# Patient Record
Sex: Female | Born: 1935 | Race: White | Hispanic: No
Health system: Midwestern US, Community
[De-identification: ages and names within clinical notes are randomized; demographics above are authoritative.]

## PROBLEM LIST (undated history)

## (undated) DIAGNOSIS — Z1231 Encounter for screening mammogram for malignant neoplasm of breast: Secondary | ICD-10-CM

## (undated) DIAGNOSIS — R300 Dysuria: Secondary | ICD-10-CM

## (undated) DIAGNOSIS — H6123 Impacted cerumen, bilateral: Secondary | ICD-10-CM

## (undated) DIAGNOSIS — M542 Cervicalgia: Secondary | ICD-10-CM

## (undated) DIAGNOSIS — M1990 Unspecified osteoarthritis, unspecified site: Secondary | ICD-10-CM

## (undated) DIAGNOSIS — Z139 Encounter for screening, unspecified: Secondary | ICD-10-CM

## (undated) DIAGNOSIS — M47812 Spondylosis without myelopathy or radiculopathy, cervical region: Secondary | ICD-10-CM

## (undated) DIAGNOSIS — K5730 Diverticulosis of large intestine without perforation or abscess without bleeding: Secondary | ICD-10-CM

---

## 1993-07-07 LAB — PAP SMEAR
Clinical information: NEGATIVE
Pathology Report: NORMAL

## 1995-03-01 LAB — PAP SMEAR: Pathology Report: NORMAL

## 1996-02-04 LAB — PAP SMEAR: Last menstrual period reported: 15

## 1999-01-17 LAB — PAP SMEAR

## 2004-11-08 ENCOUNTER — Encounter

## 2010-01-31 ENCOUNTER — Encounter

## 2010-05-09 IMAGING — CR DG CHEST 2V
2 series · 2 of 2 positions shown · non-contrast
Comparison: None

CLINICAL DATA: Preop for knee replacement

CHEST - 2 VIEW

[view not recorded (1 of 2)]
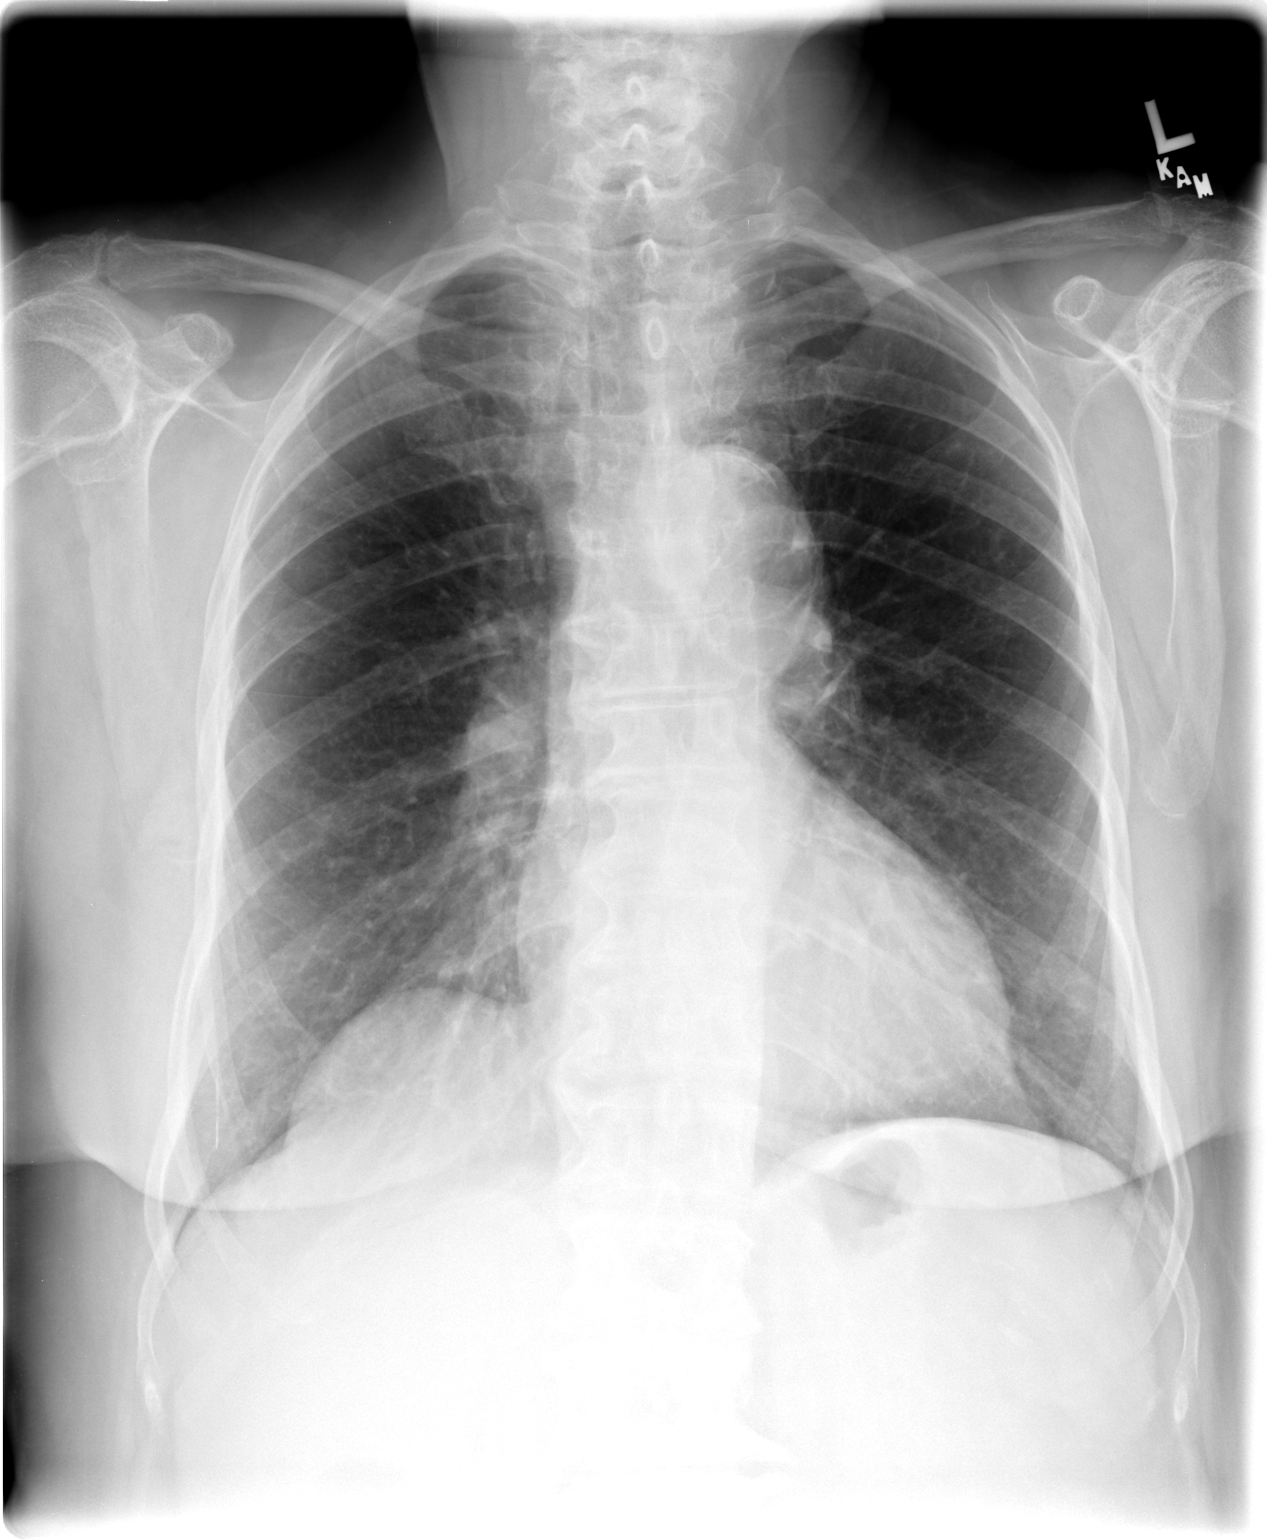

[view not recorded (2 of 2)]
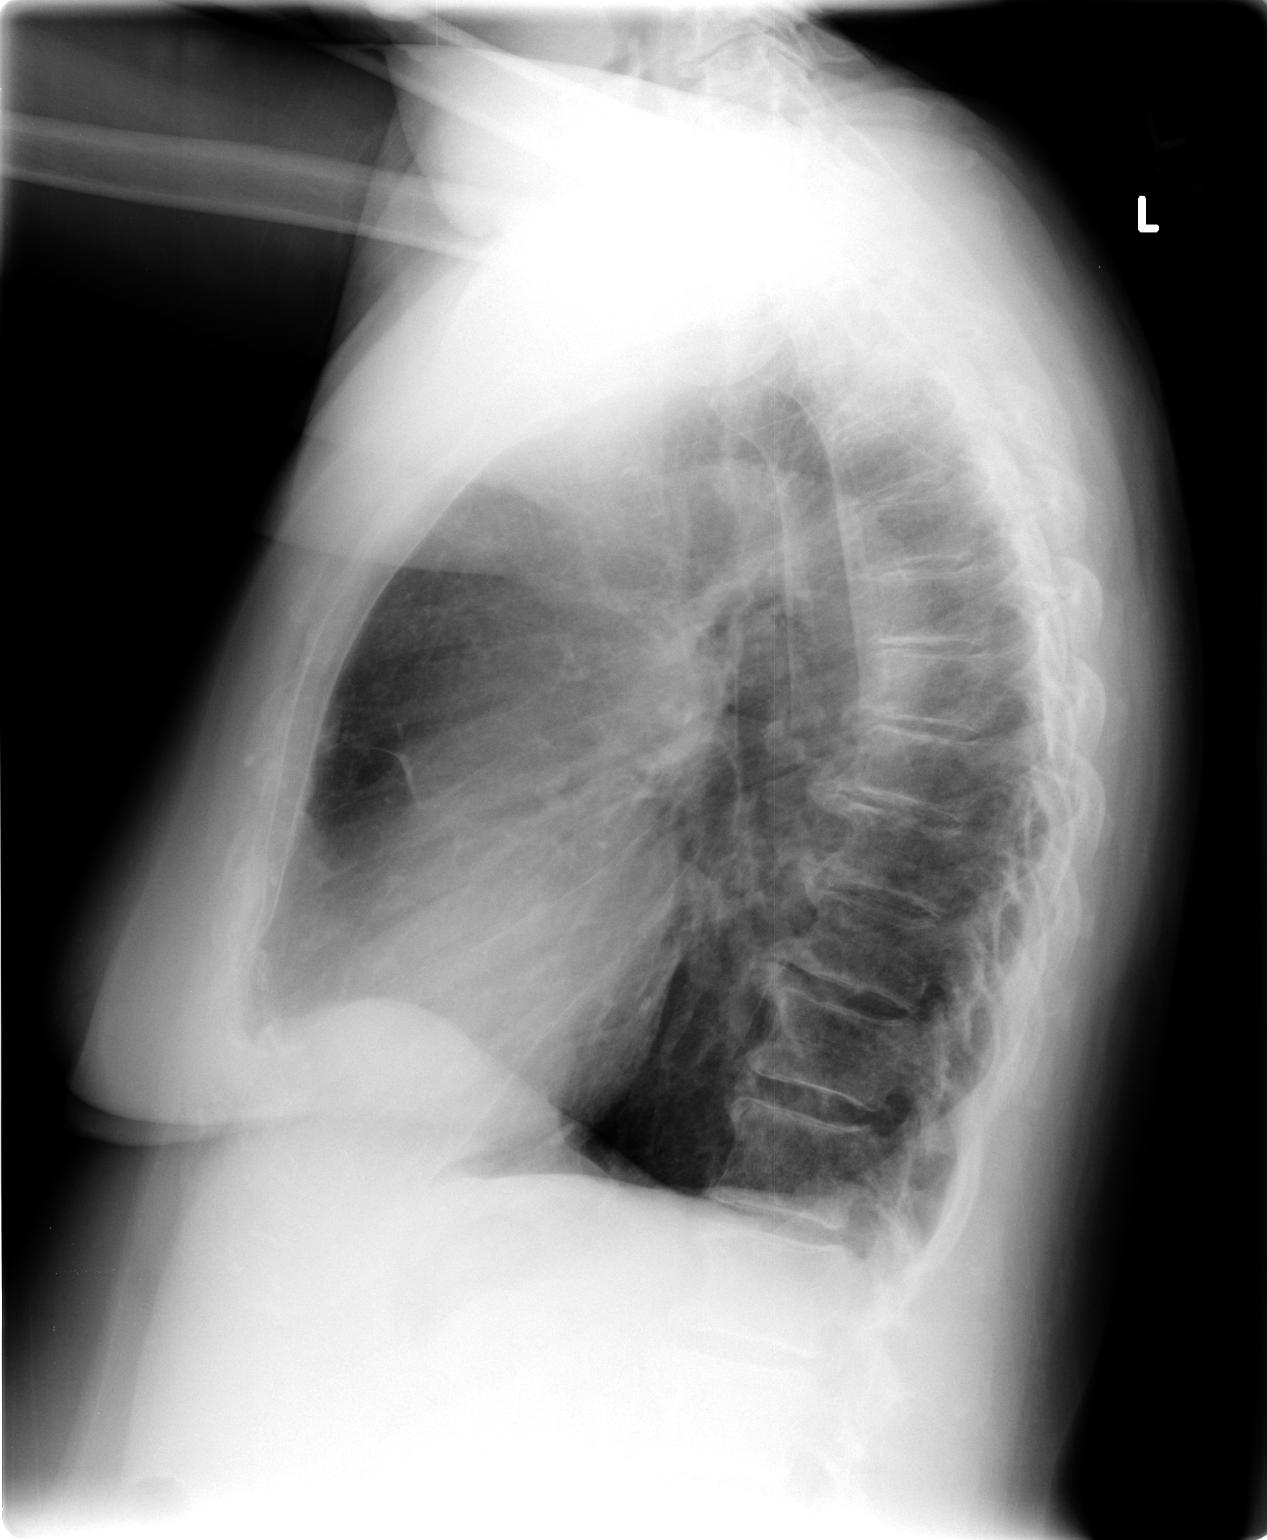

[2 of 2 positions shown; findings below may reference images not displayed]

FINDINGS: The lungs are clear but hyperaerated.  The heart is
within normal limits in size.  There are degenerative changes
throughout the thoracic spine.
IMPRESSION: No active lung disease.  Slight hyperaeration.

## 2010-06-01 NOTE — Progress Notes (Addendum)
See entries from Nurse/Technician from earlier in today's visit .    The identity of the patient, Leslie Hammond, was confirmed by  name & medical record number     EOM:  OU full ductions    Muscle Balance:  Orthophoria        SLE:             Lids/lashes: OU 1+ blepharitis                   Conj/sclera: OU no injection                            Cornea: OU clear without staining                      A/C:    OU deep and quiet                       Iris:    OU normal                        Lens:  OU  nuclear sclerosis (1-2+); mild cortical spoking                 FUNDUS:        Disc (C/D):      OU pink and sharp margins     0.4 cupping OU                      Macula:       OU normal with one drusen inferior, peripheral macula OS (as before)                  Periphery:       OU no holes or tears or RD                   IMPRESSION/PLAN:                          1) Nuclear Sclerosis (moderate) OU   -patient not desiring surgery at present; will call if decides to proceed                -handout given regarding cataracts/cataract surgery                          2) Blepharitis OU- pt instructed regarding lid scrubs/hygiene   -done today in office by me to demonstrate technique       Patient also to trial artificial tears +/- Zaditor for itching & tearing                                                                              RTC 1 year/prn sooner changes    The patient's  examination today included the use of dilating drops.  The patient was informed of the potential, time-limited detrimental effects of these medications on the vision and the possible inability of the patient to safely drive.  The patient was also  offered sunglasses to assist with the photosensitivity, sometimes experienced with pharmacologic dilation.

## 2010-06-01 NOTE — Patient Instructions (Addendum)
At your next visit please plan to be here for 1 to 2 hours. Your visit may include a dilated eye exam and additional testing.    Please bring your glasses with you to future eye appointments.    Please bring eye medications you are taking, including any over the counter products.    You may be receiving a survey in the mail regarding your satisfaction with this ophthalmology visit. We in the Ophthalmology Department would like to request that you complete this questionnaire because your feedback is valuable and will allow us to continue providing the highest quality care.

## 2010-06-01 NOTE — Progress Notes (Addendum)
The identity of patient, Leslie Hammond , a 77 year old year old female, has been verified by name and medical record number.  Chief Complaint   Patient presents with   . CATARACT     Here for yearly check. States OD>>OS waters, itches , turns red and  it fells like there is a film over it.      Pts medications, allergies and smoking history reviewed.    PAST OCULAR HISTORY:  PVD OD  Cataracts OU   Hx of Skin Tags OU (multiple) removed 04/15/09      Last Dilation: 04/15/09  Last C/D: 0.4 ou   Last Visual Field: n/a     EYE MEDICATIONS:  None      PAST MEDICAL HISTORY:   Diabetes: no   Hypertension: no   Sarcoid/Autoimmune: no   Thyroid: no     FAMILY OCULAR HISTORY:   Glaucoma: no   AMD: no    No results found for this basename: HGA1C       Angles: appear open ou    Iris: no nvi ou    Pt. Education: none    An After Visit Summary was printed and given to the patient.

## 2010-06-15 LAB — MICROSCOPIC URINALYSIS: WBC, UA: >100 /HPF (ref 0–5)

## 2010-06-15 LAB — URINALYSIS, DIPSTICK ONLY
Bilirubin Urine: NEGATIVE
Glucose, Ur: NEGATIVE GM%
Ketones, Urine: NEGATIVE MG/DL
Nitrite, Urine: POSITIVE — AB
Protein, UA: 100 MG/DL — AB
Specific Gravity, UA: 1.015 (ref 1.005–1.030)
Urobilinogen, Urine: 0.2 U/dL (ref 0.2–4.0)
pH, UA: 7.5 (ref 5.0–8.0)

## 2010-06-15 NOTE — Progress Notes (Addendum)
The patient, Leslie Hammond, identity was verified by name and MRN.    Inc burning w uriantion  No fever  Has tried inc cranberry to no avail    Also req routine labs    BP 134/68   Pulse 96   Temp(Src) 97.8 ??F (36.6 ??C) (Oral)   Resp 16   Ht 5\' 3"  (1.6 m)   Wt 183 lb (83.008 kg)   BMI 32.42 kg/m2  Physical Examination: General appearance - alert, well appearing, and in no distress  Ua ntoed    ASSESSMENT/PLAN:    DYSURIA  (primary encounter diagnosis)  Note: uti noted  Plan: SULFAMETHOXAZOLE-TRIMETHOPRIM 800-160 MG ORAL         TAB, LIPID PANEL, NON-FASTING (DIRECT LDL, HDL,        CHOL), ALT, PLASMA, BASIC METABOLIC PANEL (NA,         K, CL, CO2, BUN, CR, GLU, TOTAL CA)        Fluids ad lib      FOLLOW-UP: sx persist

## 2010-06-16 LAB — BASIC METABOLIC PANEL
Anion Gap: 18.1 mmol/L — ABNORMAL HIGH (ref 7–18)
BUN: 14 MG/DL (ref 7–17)
CO2: 26 mmol/L (ref 22–31)
Calcium: 9.5 MG/DL (ref 8.4–10.2)
Chloride: 104 mmol/L (ref 99–111)
Creatinine: 0.7 MG/DL (ref 0.52–1.04)
GFR African American: 60 mL/min/m
GFR Non-African American: >60 mL/min/m (ref >60)
Glucose: 102 MG/DL (ref 80–115)
Potassium: 5.1 mmol/L — ABNORMAL HIGH (ref 3.5–5.0)
Sodium: 143 mmol/L (ref 135–150)

## 2010-06-16 LAB — LIPID PANEL
Cholesterol, Total: 232 MG/DL — ABNORMAL HIGH (ref <200)
HDL: 44 MG/DL (ref >40)
LDL Direct: 141 MG/DL — ABNORMAL HIGH (ref 30–130)

## 2010-06-16 LAB — ALT: ALT: 22 U/L (ref 9–52)

## 2010-06-21 ENCOUNTER — Telehealth

## 2010-06-21 NOTE — Telephone Encounter (Addendum)
Attempted to reach at listed home number. message left on tad. when member  Returns call please relay dr Signa Kell message

## 2010-06-21 NOTE — Telephone Encounter (Addendum)
Pt currently treated for uti  Pls have her repeat ua after atb to ensure all clear    chol mild elevation follow diet  Potassium very mildly elevated, pls refrain from inc k in diet    Otherwise rest of test good

## 2010-06-21 NOTE — Telephone Encounter (Addendum)
Pt given Dr. Precious Haws as noted, Pt. Relates good understanding.  Low chol and potassium info has been mailed to pt.

## 2010-06-22 LAB — URINE WITH REFLEXED MICRO
Bilirubin Urine: NEGATIVE
Blood, Urine: NEGATIVE
Glucose, Ur: NEGATIVE GM%
Ketones, Urine: NEGATIVE MG/DL
Leukocyte Esterase, Urine: NEGATIVE
Nitrite, Urine: NEGATIVE
Protein, UA: NEGATIVE MG/DL
Specific Gravity, UA: 1.014 (ref 1.005–1.030)
Urobilinogen, Urine: NEGATIVE U/dL (ref 0.2–4.0)
pH, UA: 7 (ref 5.0–8.0)

## 2011-02-11 NOTE — Telephone Encounter (Addendum)
mbr checking status of msg.    Called to facility spoke with Bev who instructed msg be sent to you.   mbr may be reached at   Telephone Information:   Home Phone [phone]   Work Phone Not on file.     Thank you

## 2011-02-11 NOTE — Telephone Encounter (Addendum)
Office Visit  Leslie Hammond 77 year old female  Chief Complaint: leg pain and swelling x 3 days now ankle purple and red and calf is swollen,      With Provider: Please call mbr for appt     Preferred Days:  Please call mbr for appt       Preferred Time:  no preference    Preferred Location: [place]    Preferred Appointment: Based on template review appointment is being forwarded to the department for scheduling assistance    Contact Phone Number:   Telephone Information:   Home Phone [phone]         Patient advised that office/PCP has 24-48 business hours to return their call.   [name] a 77 year old with chief complaint of leg pain and swelling x 3 days now ankle purple and red and calf is swollen,.Triaged and assessed, attempted to make ,  informed mbr that will send message to office and if has not heard from [place] later today give [place] a call back there may be message waiting for  Her, mbr can be reached at   [phone].       Triaged and Assessed: Vascular Prob. (Lower Extremeties)-P Protocol    N Bleeding that won't stop with pressure and signs of hypovolemia (dizziness, tachycardia, etc).    N Sudden onset of cold, pale. and/or painful extremity.    N Leg edema/pain with SOB or chest pain.      N Non healing intermittantly bleeding varicosity.    N Signs of infection/elevated temperature.    N History of DVT    Y Gradual onset sevr/moderate pain and edema    Schedule SDA (appt within 2-24h) or follow UC process.  1) Apply direct pressure if bleeding.  2) Avoid rubbing or massaging area.   3) Avoid tight clothing.  4) Avoid crossing legs at knees.  5) Avoid sitting or standing for prolonged periods.     7) Call back if sx persist, change or worsen.  Marland Kitchen

## 2011-02-11 NOTE — Telephone Encounter (Addendum)
Pls have her got to uc/ed for eval re leg swelling and color changes

## 2011-02-11 NOTE — Telephone Encounter (Addendum)
Office Visit  Leslie Hammond 77 year old female  Chief Complaint: leg pain and swelling x 3 days now ankle purple and red and calf is swollen,      With Provider: Please call mbr for appt     Preferred Days:  Please call mbr for appt       Preferred Time:  no preference    Preferred Location: [place]    Preferred Appointment: Based on template review appointment is being forwarded to the department for scheduling assistance    Contact Phone Number:   Telephone Information:   Home Phone [phone]         Patient advised that office/PCP has 24-48 business hours to return their call.   Leslie Hammond a 77 year old with chief complaint of leg pain and swelling x 3 days now ankle purple and red and calf is swollen,.Triaged and assessed, attempted to make ,  informed mbr that will send message to office and if has not heard from [place] later today give [place] a call back there may be message waiting for  Her, mbr can be reached at   [phone].       Triaged and Assessed: Vascular Prob. (Lower Extremeties)-P Protocol    N Bleeding that won't stop with pressure and signs of hypovolemia (dizziness, tachycardia, etc).    N Sudden onset of cold, pale. and/or painful extremity.    N Leg edema/pain with SOB or chest pain.      N Non healing intermittantly bleeding varicosity.    N Signs of infection/elevated temperature.    N History of DVT    Y Gradual onset sevr/moderate pain and edema    Schedule SDA (appt within 2-24h) or follow UC process.  1) Apply direct pressure if bleeding.  2) Avoid rubbing or massaging area.   3) Avoid tight clothing.  4) Avoid crossing legs at knees.  5) Avoid sitting or standing for prolonged periods.     7) Call back if sx persist, change or worsen.  Marland Kitchen

## 2011-02-15 NOTE — Progress Notes (Addendum)
Reviewed above:    Leslie Hammond 77 year old female with issues expressed to the nurse   Chief Complaint   Patient presents with   ??? HOSPITAL FOLLOW UP     per member was seen in [facility] for cellulitis in right leg    .            Recorded Prescriptions    Current Outpatient Prescriptions   Medication Sig Dispense Refill   ??? DESOXIMETASONE 0.25 % TOP CREA APPLY TWICE DAILY AS NEEDED TO LEGS  60  5       More record Information    BP Readings from Last 3 Encounters:   02/15/11 136/68   06/15/10 134/68   06/01/10 142/77     Wt Readings from Last 3 Encounters:   02/15/11 172 lb (78.019 kg)   06/15/10 183 lb (83.008 kg)   06/01/10 180 lb (81.647 kg)     WBC COUNT   Date Value Range Status   03/04/2008 5.4  4.5-11.0 (K/uL) Final        HEMATOCRIT   Date Value Range Status   03/04/2008 39.2  36.0-48.0 (%) Final        HGB   Date Value Range Status   03/04/2008 13.4  12.0-16.0 (GM/DL) Final        PLATELETS, AUTOMATED COUNT   Date Value Range Status   03/04/2008 235  150-450 (K/uL) Final        CHOLESTEROL   Date Value Range Status   06/15/2010 232* <200 (MG/DL) Final        HDL   Date Value Range Status   06/15/2010 44  >40 (MG/DL) Final      (NOTE)      >=60 mg/dl is Desirable        TRIGLYCERIDE   Date Value Range Status   09/02/2004 132  35-160 (MG/DL) Final        ALT   Date Value Range Status   06/15/2010 22  9-52 (U/L) Final        AST   Date Value Range Status   03/04/2008 34  14-48 (U/L) Final        BUN   Date Value Range Status   06/15/2010 14  7-17 (MG/DL) Final        CREATININE   Date Value Range Status   06/15/2010 0.70  0.52-1.04 (MG/DL) Final        POTASSIUM   Date Value Range Status   06/15/2010 5.1* 3.5-5.0 (mmol/L) Final        SODIUM   Date Value Range Status   06/15/2010 143  135-150 (mmol/L) Final        CO2   Date Value Range Status   06/15/2010 26  22-31 (mmol/L) Final        CHLORIDE    Value Range Status   06/15/2010 104  99-111 (mmol/L) Final        TSH   Date Value Range Status   03/04/2008 1.780   0.465-4.68 (MIU/ML) Final        GLUCOSE, FASTING   Date Value Range Status   09/02/2004 108  70-110 (MG/DL) Final        GLUCOSE, RANDOM   Date Value Range Status   06/15/2010 102  80-115 (MG/DL) Final         Immunization History   Administered Date(s) Administered   ??? INF (Influenza) unspecified formulation 02/09/1996, 01/23/1998, 03/01/1999, 02/21/2000, 02/27/2001, 01/23/2002, 01/23/2002, 03/03/2003, 01/27/2004,  02/03/2009   ??? INFs (Influenza split virus ). 02/11/2011   ??? INFs 101yrs and over (Influenza) 01/24/2010   ??? INFs 67yrs-adult (Influenza) 02/09/2005, 01/31/2006, 03/04/2008   ??? INFs 47yrs and over (influenza) 02/26/2007   ??? PNUps (Pneumococcal polysaccharide, pneumonia) 02/26/1997   ??? Td 75yrs-adult (Tetanus, diphtheria) 05/02/2000             Today      Reviewed above    Vitals BP 136/68   Pulse 60   Temp(Src) 97.8 ??F (36.6 ??C) (Oral)   Resp 20   Ht 5' 3.5" (1.613 m)   Wt 172 lb (78.019 kg)   BMI 29.99 kg/m2      HPI:  Patient concerned with - here for follow up cellulitis.  Had negative [place] for dvt.  Told had cellulitis and sent out on both keflex and bactrim.  Thinks the bactrim is upsetting her stomach.  States the right leg swelling is much better.  Chronic exzema of her lower legs.  Denies itching prior to the episode.  Does state had been out in the yard working on her gardenseveral days before.        Physical Exam  GEN alert oriented, healthy appearing wf, no distress   extr - no redness, warmth or swelling.  Does have varicose veins and chronic venous stasis changes.     OTHER ISSUES RAISED none    ASSESSMENT  Diagnosis   1. CELLULITIS (682.9)  - much improved after 5 days of antibiotics.  Will discontinue the bactrim due to gi upset and complete the course of keflex.     2. ECZEMA (692.9)  - stable     3. VARICOSE VEINS OF LOWER LIMB, W COMPLICATION OF EDEMA, PAIN, OR SWELLING (454.8) - stable.         PLAN:  May discontinue the sulfa-tmp.     Complete the 10 day  course of the cephalexin     tanish  - please get me the [place] papers.      Follow Up if redness, warmth, swelling recur or as previously advised by your pcp.             RETURN TO CLINIC IF YOU HAVE NEW OR WORSE SYMPTOMS

## 2011-02-15 NOTE — Patient Instructions (Addendum)
ASSESSMENT  Diagnosis   1. CELLULITIS (682.9)  - much improved after 5 days of antibiotics.  Will discontinue the bactrim due to gi upset and complete the course of keflex.     2. ECZEMA (692.9)  - stable     3. VARICOSE VEINS OF LOWER LIMB, W COMPLICATION OF EDEMA, PAIN, OR SWELLING (454.8) - stable.         PLAN:  May discontinue the sulfa-tmp.     Complete the 10 day  course of the cephalexin     tanish - please get me the [place] papers.      Follow Up if redness, warmth, swelling recur or as previously advised by your pcp.             RETURN TO CLINIC IF YOU HAVE NEW OR WORSE SYMPTOMS

## 2011-03-06 ENCOUNTER — Encounter

## 2011-04-27 NOTE — Progress Notes (Addendum)
The patient, ANNALEE MEYERHOFF, identity was verified by name and MRN.    Glenford Peers sx started 3 weeks ago  No fever  Seem better last 2 days but cont to have nasal discharge and feeling that sinuses are full  Not on any meds currently    BP 124/72   Pulse 82   Temp(Src) 97.5 ??F (36.4 ??C) (Oral)   Resp 16   Ht 5\' 5"  (1.651 m)   Wt 177 lb (80.287 kg)   BMI 29.45 kg/m2  Physical Examination: General appearance - alert, well appearing, and in no distress  Mouth - mucous membranes moist, pharynx normal without lesions  Chest - clear to auscultation, no wheezes, rales or rhonchi, symmetric air entry      ASSESSMENT/PLAN:    VIRAL RESPIRATORY INFECTION  (primary encounter diagnosis)  Note: likleya t tail end, but can try otc clairitin d 1-2x/day to help with sx    FOLLOW-UP: sx persist/worsens

## 2011-04-27 NOTE — Patient Instructions (Addendum)
Claritin d 1-2x/day

## 2011-05-10 NOTE — Progress Notes (Addendum)
Flu documented 02/13/11 already

## 2012-01-11 ENCOUNTER — Encounter

## 2012-01-11 NOTE — Patient Instructions (Addendum)
 ASSESSMENT  Diagnosis   1. SCREENING FOR OSTEOPOROSIS (V82.81)    2. PROPHYLACTIC VACCINE FOR INFLUENZA (V04.81)    3. BILAT CERUMEN IMPACTION (380.4)    4. NECK PAIN (723.1)        PLAN:  Orders Placed This Encounter   . DEXA BONE DENSITY, AXIAL SKELETON [77080C]   . XR CERVICAL SPINE 2 OR 3 VIEWS   . VACC INFLUENZA, TRIVALENT, SPLIT VIRUS, 4 YRS-ADULT [90658G]   . VACC ADMIN, FIRST IM OR SUBQ VACCINE TOXOID [90471B]   . FLUVIRIN 2013-2014 45 MCG (15 MCG X 3)/0.5 ML IM SUSP   . FLUVIRIN 2013-2014 45 MCG (15 MCG X 3)/0.5 ML IM SUSP   . EAR DROPS (CARBAMIDE PEROXIDE) 6.5 % OTIC DROP   Continue aleve   Apply moistheat to neck several times a day for 15 minutes at a time  Neck booklet   Follow up in 4 days for nurse clinic ear irrigation   Follow Up Call or return to clinic prn if these symptoms worsen or fail to improve as anticipated.            At this time in your life, it's more important than ever to be fit and stay  healthy. [employer] can help you reach your health and fitness  goals through use of a basic fitness membership at many local fitness centers.     Your  [facility] Medicare Plus includes a Psychologist, counselling at no additional cost. Your fitness membership includes:  . use of equipment  . group exercise classes  . social events and much more    To activate your [employer] Membership, and find a convenient location near your home, call [phone] ([email or web address]:[phone]), or log on to the website at:  [email or web address]

## 2012-01-11 NOTE — Progress Notes (Addendum)
The patient, Leslie Hammond, identity was verified by name and MRN.    The patient presents with c/o 3 week hx of trouble raising left arm  Pain in left side of neck down shoulder , thinks getting better     States feeling like something moving or crawling in neck   Denies numbness, tingling,  Denies trauma  Using aleve which helps, also using weights which improves motion     Denies chest pain, sob   Has hearing aids , does not use     P.E.  BP 122/68   Pulse 76   Temp(Src) 98.2 ??F (36.8 ??C) (Oral)   Resp 18   Ht 5\' 5"  (1.651 m)   Wt 177 lb (80.287 kg)   BMI 29.45 kg/m2   tm's not well visualized due to cerumen bilat   Neck: no spinal tenderness, fair rom with flexion, extension, rotation  Some discomfort with flexion and rotation to left   +tenderness left upper back musculature   Strength 5/5 bilat ue/le   Slow motion with reaching back , slightly decreased     ASSESSMENT  Diagnosis   1. SCREENING FOR OSTEOPOROSIS (V82.81)    2. PROPHYLACTIC VACCINE FOR INFLUENZA (V04.81)    3. BILAT CERUMEN IMPACTION (380.4)    4. NECK PAIN (723.1)        PLAN:  Orders Placed This Encounter   ??? DEXA BONE DENSITY, AXIAL SKELETON [77080C]   ??? XR CERVICAL SPINE 2 OR 3 VIEWS   ??? VACC INFLUENZA, TRIVALENT, SPLIT VIRUS, 4 YRS-ADULT [90658G]   ??? VACC ADMIN, FIRSTIM OR SUBQ VACCINE TOXOID [90471B]   ??? FLUVIRIN 2013-2014 45 MCG (15 MCG X 3)/0.5 ML IM SUSP   ??? FLUVIRIN 2013-2014 45 MCG (15 MCG X 3)/0.5 ML IM SUSP   ??? EAR DROPS (CARBAMIDE PEROXIDE) 6.5 % OTIC DROP   Continue aleve   Apply moist heat to neck several times a day for 15 minutes at a time  Neck booklet   Follow up in 4 days for nurse clinic ear irrigation   Follow Up Call or return to clinic prn if these symptoms worsen or fail to improve as anticipated.

## 2012-01-18 ENCOUNTER — Telehealth

## 2012-01-18 ENCOUNTER — Encounter

## 2012-01-18 NOTE — Telephone Encounter (Addendum)
Pt present in nurse clinic, requesting results of x-ray from last week. Pt also requesting physical therapy.

## 2012-01-18 NOTE — Telephone Encounter (Addendum)
I have attempted to contact this patient by phone with the following results: left message to return my call on answering machine.

## 2012-01-18 NOTE — Progress Notes (Addendum)
The patient, Leslie Hammond, identity was verified by name, MRN and address. Supervising provider for clinic visit: Dr. Dorothey Baseman.     Pt present in nurse clinic for bilateral ear lavage. Pt tolerated procedure well with no complaints of pain or discomfort during or after procedure. Large amount of blackish/brown cerumen removed from right ear and small amount of brown cerumen removed from pt left ear. Tympanic membrane visible, glistening gray in color in bilateral ears.

## 2012-01-18 NOTE — Telephone Encounter (Addendum)
I have reviewed the provider's instructions with the patient, answering all questions to her satisfaction.

## 2012-01-18 NOTE — Telephone Encounter (Addendum)
PT ALREADY SEEN DR Dorothey Baseman RECENTLY , HER CERVICAL SPINE X-RAY SHOWS ARTHRITIS ,  PER DR Dorothey Baseman NOTE TO USE MOIST HEAT ,  SINCE SHE REQUESTED PT , REFERRAL PLACED , PLEASE ASSIST WITH THE REFFERAL THANKS Maurilio Puryear M.D.

## 2012-01-23 NOTE — Telephone Encounter (Addendum)
Pls fax this as an order    Physical therapy eval for neck pain  Dellis Voght, md

## 2012-01-23 NOTE — Telephone Encounter (Addendum)
Pt called and msg given on home tad for return call

## 2012-01-23 NOTE — Telephone Encounter (Addendum)
I have reviewed the provider's instructions with the patient, answering all questions to her satisfaction.  Mbr's birth date verified.

## 2012-01-23 NOTE — Telephone Encounter (Addendum)
Name of Caller: Misty Stanley   Relation to member: scheduler  Organization: [facility]  Contact Number: [phone]  Fax: [phone]  When best reached: 8-530 pm  Reason for call: patient is scheduled for m10/14/13 and [facility] is requiring the PT order faxed to them at above fax number before her appt. Thanks. Please assist.    Patient advised that office/PCP has 24-48 business hours to return their call.

## 2012-01-23 NOTE — Telephone Encounter (Addendum)
Xr neck shows severe arthritis  Can try physical therapy a previously ordered  If despite this pain persist may need to see pain management or ortho

## 2012-01-23 NOTE — Telephone Encounter (Addendum)
Dr. Loralie Champagne,   Misty Stanley from [place] would like an order for PT. Please see message below from call center.    Thanks

## 2012-01-24 NOTE — Telephone Encounter (Addendum)
Note faxed to [facility] for PT

## 2012-09-27 MED ORDER — DESOXIMETASONE 0.25 % EX CREA
0.25 % | CUTANEOUS | Status: AC
Start: 2012-09-27 — End: 2013-09-27

## 2012-11-18 NOTE — Patient Instructions (Addendum)
ASSESSMENT  Diagnosis   1. NECK PAIN (723.1)        PLAN:  Orders Placed This Encounter   . REFERRAL THERAPY, PHYSICAL   . MELOXICAM  7.5 MG ORAL TAB- take with food    Stop aleve   Can use tylenol 500 mg - 1 pill every 8 hours if needed   Apply moist heat to neck several times a day for 15 minutes at a time   Follow Up with Dr. Loralie Champagne after completion of physical therapy or Call or return to clinic prn if these symptoms worsen or fail to improve as anticipated.          this time in your life, it's more important than ever to be fit and stay  healthy. [employer] can help you reach your health and fitness  goals through use of a basic fitness membership at many local fitness centers.     Your  [facility] Plus includes a Psychologist, counselling at no additional cost. Your fitness membership includes:  . use of equipment  . group exercise classes  . social events and much more    To activate your [employer] Membership, and find a convenient location near your home, call [phone] (TTY: [phone]), or log on to the website at:  [email or web address] [facility] Instructions  A Healthy Lifestyle: After Your Visit  Your Care Instructions  A healthy lifestyle can help you feel good, stay at a healthy weight, and have plenty of energy for both work and play. A healthy lifestyle is something you can share with your whole family.  A healthy lifestyle also can lower your risk for serious health problems, such as high blood pressure, heart disease, and diabetes.  You can follow a few steps listed below to improve your health and the health of your family.  Follow-up care is a key part of your treatment and safety. Be sure to make and go to all appointments, and call your doctor if you are having problems. It's also a good idea to know your test results and keep a list of the medicines you take.  How can you care for yourself at home?   Do not eat too much sugar, fat, or fast  foods. You can still have dessert and treats now and then. The goal is moderation.   Start small to improve your eating habits. Pay attention to portion sizes, drink less juice and soda pop, and eat more fruits and vegetables.   Eat a healthy amount of food. A 3-ounce serving of meat, for example, is about the size of a deck of cards. Fill the rest of your plate with vegetables and whole grains.   Limit the amount of soda and sports drinks you have every day. Drink more water when you are thirsty.   Eat at least 5 servings of fruits and vegetables every day. It may seem like a lot, but it is not hard to reach this goal. A serving or helping is 1 piece of fruit, 1 cup of vegetables, or 2 cups of leafy, raw vegetables. Have an apple or some carrot sticks as an afternoon snack instead of a candy bar. Try to have fruits and/or vegetables at every meal.   Make exercise part of your daily routine. You may want to start with simple activities, such as walking, bicycling, or slow swimming. Try to be active 30 to 60 minutes every day. You do not need to do all 30 to 60  minutes all at once. For example, you can exercise 3 times a day for 10 or 20 minutes.Moderate exercise is safe for most people, but it is always a good idea to talk to your doctor before starting an exercise program.   Keep moving. Mow the lawn, work in the garden, or [employer]. Take the stairs instead of the elevator at work.   If you smoke, quit. People who smoke have an increased risk for heart attack, stroke, cancer, and other lung illnesses. Quitting is hard, but there are ways to boost your chance of quitting tobacco for good.   Use nicotine gum, patches, or lozenges.   Ask your doctor about stop-smoking programs and medicines.   Keeptrying.  In addition to reducing your risk of diseases in the future, you will notice some benefits soon after you stop using tobacco. If you have shortness of breath or asthma symptoms, they will likely  get better within a few weeks after you quit.   Limit how much alcohol you drink. Moderate amounts of alcohol (up to 2 drinks a day for men, 1 drink a day for women) are okay. But drinking too much can lead to liver problems, high blood pressure, and other health problems.  Family health  If you have a family, there are many things you can do together to improve your health.   Eat meals together as a family as often as possible.   Eat healthy foods. This includes fruits, vegetables, lean meats and dairy, and whole grains.   Include your family in your fitness plan. Most people think of activities such as jogging or tennis as the way to fitness, but there are many ways you and your family can be more active. Anything that makes you breathe hard and gets your heart pumping is exercise. Here are some tips:   Walk to do errands or to take your child to school or the bus.   Go for a family bike ride after dinner instead of watching TV.  Where can you learn more?  Go to https://www.cook.com/  Enter 469-852-9283 in the search box to learn more about "A Healthy Lifestyle: After Your Visit".  Last Revised: June 06, 2011   2006-2013 [employer], [place]. Care instructions adapted under license by your healthcare professional. If you have questions about a medical condition or this instruction, always ask your healthcare professional. [employer], [employer] disclaims any warranty or liability for your use of this information.

## 2012-11-18 NOTE — Progress Notes (Addendum)
The patient, Leslie Hammond, identity was verified by name and MRN.    The patient presents with 2 week hx of neck pain off and on with tingling left side of neck  Reports Stiffness and pain   Can sleepwithout pain , worsens during daytime with moving head/neck   Uses aleve which helps some, heat also gives short-term relief  States similar episode last fall   Physical therapy helped last time     Denies trauma     P.E.  BP 122/68   Pulse 72   Temp(Src) 98.1 ??F (36.7 ??C) (Oral)   Resp 16   Ht 5\' 5" (1.651 m)   Wt 177 lb (80.287 kg)   BMI 29.45 kg/m2  Neck: no spinal tenderness, decreased rom with extension, rotation   Pulling sensation with flexion, extension, rotation of head to left   No c- spine tenderness   + tenderness neck and upper back muscles left with palpation  Strength 5/5 bilat ue/le         ASSESSMENT  Diagnosis   1. NECK PAIN (723.1)        PLAN:  Orders Placed This Encounter   ??? REFERRAL THERAPY, PHYSICAL   ??? MELOXICAM  7.5 MG ORAL TAB- take with food    Stop aleve   Can use tylenol 500 mg - 1 pill every 8 hours if needed   Apply moist heat to neck several times a day for 15 minutes at a time   Follow Up with Dr. Loralie Champagne after completion of physical therapy or Call or return to clinic prn if these symptoms worsen or fail to improve as anticipated.

## 2013-05-22 MED ORDER — SULFAMETHOXAZOLE-TRIMETHOPRIM 800-160 MG PO TABS
800-160 MG | ORAL_TABLET | Freq: Two times a day (BID) | ORAL | Status: AC
Start: 2013-05-22 — End: 2013-05-25

## 2013-05-22 NOTE — Telephone Encounter (Signed)
80 yo pt with c/o intermittent pelvic pressure, intermittent dysuria, ongoing for 2 weeks. Pt sts has been pushing fluids and cranberry juice but not resolving symptoms. Pt denies fever, denies hematuria. Pt had UTI 05/2010. Telephone visit scheduled for today @ 1110.    Protocol: URINARY SYMPTOMS  Negative: Signs of systemic toxicity - 1 or more of the following:   - Fever 102.0 or higher   - Altered mental status.   - Diaphoresis   - Near syncope   - Vomiting  Negative: Last void more than 8 hours ago.  N/A: Less than [redacted] weeks pregnant with signs and symptoms of urinary tract infection or urinary problems.  N/A: More than [redacted] weeks pregnant with signs and symptoms of urinary tract infection or urinary problems.  N/A: Pessary in place.  Negative: History of urinary catheter or other procedure within last 2 weeks.  N/A: Female  Negative: Female over the age of 78 without a previous urinary tract infection.  Negative: Any 1 or more of the following symptoms:   - Fever 101 or higher   - Chills   - Pain in the back, flank or abdomen  Affirmative: History of:   - Diabetes   - Blood in the urine   - Symptoms longer than 7 days    - Kidney stones   - Chronic kidney disease   - 2 or more Urinary Tract Infections in last 12 months   - Failure of Urinary Tract Infection treatment in last 4 weeks    Level of Care: Process    Schedule Same Day telephone visit with medicine provider for the same day.      After Business Hours: Page on call provider    During Business Hours and unable to schedule Same Day  telephone visit:   -Send high priority message using  [email or web address] request to appointment pool  -Advise the patient they will receive a call from the Provider's office within 1 hour   -Contact the Provider's office  -Advise the Provider's office of the need for a Same Day telephone appointment for Urinary Tract Infection symptoms  -Warm transfer the call to Provider's office      Triage Plan:  Demographics verified,  Instructed to call back if symptoms persist/change/worsen and Verbalized understanding/willingness to follow instructions.

## 2013-05-22 NOTE — Telephone Encounter (Signed)
Spoke with patient  Reports 10-14 day hx of lower abd pressure, urinary frequency , dysuria   Denies fever, n, v  Otherwise feels well  Not on any medications   nkda  States unable to come in to give urine sample due to weather  States feels same as uti she had in past   Will order bactrim ds p.o bid x 3 days , increase water intake  F/u Call or return to clinic prn if these symptoms worsen or fail to improve as anticipated.  [facility] IncJAMEELAH D Quadarius Henton

## 2013-05-27 LAB — URINALYSIS
Bilirubin Urine: NEGATIVE
Blood, Urine: NEGATIVE
Glucose, Ur: NEGATIVE mg/dL
Ketones, Urine: NEGATIVE mg/dL
Leukocyte Esterase, Urine: NEGATIVE
Nitrite, Urine: NEGATIVE
Protein, UA: NEGATIVE mg/dL
Specific Gravity, UA: 1.02 (ref 1.005–1.030)
Urobilinogen, Urine: 0.2 E.U./dL (ref 0.2–4.0)
pH, UA: 6 (ref 5.0–8.0)

## 2013-05-27 MED ORDER — CIPROFLOXACIN HCL 250 MG PO TABS
250 MG | ORAL_TABLET | Freq: Two times a day (BID) | ORAL | Status: AC
Start: 2013-05-27 — End: 2013-06-03

## 2013-05-27 NOTE — Progress Notes (Signed)
The patient, Leslie Hammond, identity was verified by name and MRN.    Burning w urination last week  Was unable to come in and given rx uti  x3d  Sx persist w moslty lower abd discomfort and some burning  No discharge    BP 138/70    Pulse 78    Temp(Src) 97.6 ??F (36.4 ??C) (Oral)    Resp 18    Ht 5\' 3"  (1.6 m)    Wt 175 lb (79.379 kg)    BMI 31.01 kg/m2     Physical Examination: General appearance - alert, well appearing, and in no distress    ua noted    ASSESSMENT/PLAN:    1. Dysuria  - Urinalysis Reflex to Microscopy; Future  - ciprofloxacin (CIPRO) 250 MG tablet; Take 1 tablet by mouth 2 times daily for 7 days. for infection  Dispense: 14 tablet; Refill: 0    Unsure if neg ua due to course of atb already  Trial another atb  Push fluids      FOLLOW-UP:  Sx persist after above atb

## 2013-05-27 NOTE — Progress Notes (Signed)
The patient, Leslie Hammond, identity was verified by name and MRN. Supervising provider for clinic visit: Dr. Loralie ChampagnePurisima.     Has the patient seen a provider outside of [facility] since their last visit? No    Chaperone status: offered, declined    Diabetic status:  not diabetic   not diabetic     Exercise/ Fall/Urinary incontinence assessment: exercise < 150 min/week, Health Lifestyle PI Natl added to AVS

## 2013-08-18 NOTE — Telephone Encounter (Signed)
[facility].   RE: Members disability  States expires June 2015    Please call member when Rx is ready for Pickup at:  [facility]B  Please mail Rx to member???s home address:   [address]  [place] [phone]       Patient advised that they will be called when Rx has been completed or if further information is required.    Patient advised that office/PCP has 24-48 business hours to return their call

## 2013-08-18 NOTE — Telephone Encounter (Signed)
Forwarding to provider for disposition,

## 2013-08-19 NOTE — Telephone Encounter (Signed)
rx mailed to home address

## 2013-08-19 NOTE — Telephone Encounter (Signed)
rx on my desk

## 2013-08-21 NOTE — Progress Notes (Signed)
The patient, [name], identity was verified by name and mrn. Supervising provider for clinic visit: Dr. Iver NestleJohn Salamone.     Chief Complaint   Patient presents with   ??? Foot Problem     rt foot bunion/ rt grt toenail       Allergy and medication list reviewed      Chaperone offered: offered, declined      08/21/2013  11:52 AM    Follow up: as needed      An After Visit Summary was printed and given to the patient.

## 2013-08-21 NOTE — Progress Notes (Signed)
80 y.o. Female pt with distal corn toe 3 rt foot  Pain with ambulation   Duration on and off for > 3 mo   treated with paddings and self care  In addition , complains that the thickened 4th toenail is rubbing the 3rd toe and causing pain     Non diabetic , she  denies rest pain, claudication, burning, tingling, and numbness of the feet or hands.  I have reviewed the patient's medical history in detail and updated the computerized patient record.  As well as meds and allergies     pleasant nad   dp pulses are both palpable  Toes are warm and cft is immediate  There are no ulcerations no erythema nd no st swelling  There is a distal clavus toe 3 rt foot   And offending dystrophic toenail toe 4   All rt foot nails are thickened and deformed  The lesser  toes rt foot exhibit mallet toe formation   Esp 2 , 3 and 4 right foot reducible at the dipjnt and semi rigid at the pipjnt   There is stasis hyperpigmentation and superficiale venous tortuosities noted   Dx clavus        Mallet toes        onychomysosis     Pared and fabricated a buttress pad  consider a fleor tenotomy is padding's are insufficient   Advised crest pad application daily   Fu prn   5  thickened elongated brittle and discolored nails were manually reduced in length and thickness  Iver NestleJOHN Tresia Revolorio

## 2013-08-21 NOTE — Patient Instructions (Signed)
Do you understand your plan of care today? Is there anything else I can help you with today? Your may be getting a survey in the mail regarding your care today. If you do please take a few minutes to fill it out and mail it back to us. It would be greatly appreciated.

## 2013-09-26 MED ORDER — OMEPRAZOLE 20 MG PO CPDR
20 MG | ORAL_CAPSULE | ORAL | Status: AC
Start: 2013-09-26 — End: ?

## 2013-09-26 MED ORDER — MELOXICAM 7.5 MG PO TABS
7.5 MG | ORAL_TABLET | ORAL | Status: AC
Start: 2013-09-26 — End: ?

## 2013-09-26 NOTE — Progress Notes (Signed)
The patient, Leslie Hammond, identity was verified by name and MRN.    Woke up this am w inc r knee pain  Also fetl pain back of knee hurts  Pt aware of arthritis and being in need of a replacemnt  No trauma    BP 128/60    Pulse 82    Temp(Src) 97.8 ??F (36.6 ??C) (Oral)    Resp 16    Ht 5\' 3"  (1.6 m)    Wt 171 lb (77.565 kg)    BMI 30.30 kg/m2     Physical Examination: General appearance - alert, well appearing, and in no distress  Musculoskeletal - abnormal exam of right knee w minimal swelling anteriorly no sig redness nor warmth no tenderness, able to bear weight, popliteal aspect not swollen no redness no tenderness  Extremities - no edema, redness or tenderness in the calves or thighs, varicose veins noted    ASSESSMENT/PLAN:    1. Arthritis  Sx better as the day went along  Likely arthritis  Short course nsaids  Warm compresses  - meloxicam (MOBIC) 7.5 MG tablet; 1 bid with food  Dispense: 30 tablet; Refill: 0  - omeprazole (PRILOSEC) 20 MG capsule; 1qd for stomach while on omeprazole  Dispense: 60 capsule; Refill: 2  - Basic Metabolic Panel; Future    2. Depression screening  - Negative Screen for Clinical Depression, Follow-up not Required [id number]    3. Need for vaccination for Strep pneumoniae  - PREVNAR 13 - Pneumococcal conjugate vaccine 13-valent less than 5yo IM [[id number]]        FOLLOW-UP:  Sx persist/worsens, pt encouraged to see ortho if knee issues worsening

## 2013-09-26 NOTE — Progress Notes (Signed)
The patient, Loraine Lerichellen V Ries, identity was verified by name and MRN. Supervising provider for clinic visit: Dr. Loralie ChampagnePurisima.     Has the patient seen a provider outside of [facility] since their last visit? No    Chaperone status: offered, declined    Diabetic status:  not diabetic   not diabetic     Exercise/ Fall/Urinary incontinence assessment: exercise < 150 min/week, Health Lifestyle PI Natl added to AVS     An after visit summery was printed and reviewed with the patient by Pollyann SavoyBeverly Slyvia Lartigue LPN  VIS given to patient for immunizations administered.

## 2013-09-27 LAB — BASIC METABOLIC PANEL
Anion Gap: 13 mEq/L (ref 7–13)
BUN: 19 mg/dL (ref 8–23)
CO2: 25 mEq/L (ref 22–29)
Calcium: 9 mg/dL (ref 8.6–10.2)
Chloride: 102 mEq/L (ref 98–107)
Creatinine: 0.62 mg/dL (ref 0.50–0.90)
GFR African American: >60 (ref >60)
GFR Non-African American: >60 (ref >60)
Glucose: 101 mg/dL (ref 74–109)
Potassium: 4.2 mEq/L (ref 3.5–5.1)
Sodium: 140 mEq/L (ref 132–144)
